# Patient Record
Sex: Male | Born: 1965 | Race: White | Hispanic: No | Marital: Single | Smoking: Current every day smoker
Health system: Southern US, Community
[De-identification: ages and names within clinical notes are randomized; demographics above are authoritative.]

## PROBLEM LIST (undated history)

## (undated) DIAGNOSIS — J449 Chronic obstructive pulmonary disease, unspecified: Secondary | ICD-10-CM

## (undated) DIAGNOSIS — G809 Cerebral palsy, unspecified: Secondary | ICD-10-CM

## (undated) DIAGNOSIS — M5136 Other intervertebral disc degeneration, lumbar region: Secondary | ICD-10-CM

## (undated) DIAGNOSIS — M5137 Other intervertebral disc degeneration, lumbosacral region: Secondary | ICD-10-CM

---

## 2018-01-25 ENCOUNTER — Other Ambulatory Visit: Payer: Self-pay

## 2018-01-25 ENCOUNTER — Emergency Department
Admission: EM | Admit: 2018-01-25 | Discharge: 2018-01-25 | Disposition: A | Discharge status: Home / Self Care | Payer: Medicare Other | Attending: Emergency Medicine | Admitting: Emergency Medicine

## 2018-01-25 ENCOUNTER — Emergency Department: Payer: Medicare Other

## 2018-01-25 ENCOUNTER — Encounter: Payer: Self-pay | Admitting: Emergency Medicine

## 2018-01-25 DIAGNOSIS — S199XXA Unspecified injury of neck, initial encounter: Secondary | ICD-10-CM | POA: Diagnosis present

## 2018-01-25 DIAGNOSIS — Y999 Unspecified external cause status: Secondary | ICD-10-CM | POA: Diagnosis not present

## 2018-01-25 DIAGNOSIS — S161XXA Strain of muscle, fascia and tendon at neck level, initial encounter: Secondary | ICD-10-CM | POA: Insufficient documentation

## 2018-01-25 DIAGNOSIS — S39012A Strain of muscle, fascia and tendon of lower back, initial encounter: Secondary | ICD-10-CM | POA: Diagnosis not present

## 2018-01-25 DIAGNOSIS — Z8669 Personal history of other diseases of the nervous system and sense organs: Secondary | ICD-10-CM | POA: Insufficient documentation

## 2018-01-25 DIAGNOSIS — Y9389 Activity, other specified: Secondary | ICD-10-CM | POA: Insufficient documentation

## 2018-01-25 DIAGNOSIS — Y92410 Unspecified street and highway as the place of occurrence of the external cause: Secondary | ICD-10-CM | POA: Diagnosis not present

## 2018-01-25 DIAGNOSIS — Z79891 Long term (current) use of opiate analgesic: Secondary | ICD-10-CM | POA: Diagnosis not present

## 2018-01-25 HISTORY — DX: Cerebral palsy, unspecified: G80.9

## 2018-01-25 HISTORY — DX: Other intervertebral disc degeneration, lumbar region: M51.36

## 2018-01-25 HISTORY — DX: Chronic obstructive pulmonary disease, unspecified: J44.9

## 2018-01-25 HISTORY — DX: Other intervertebral disc degeneration, lumbosacral region: M51.37

## 2018-01-25 LAB — COMPREHENSIVE METABOLIC PANEL
ALT: 49 U/L (ref 17–63)
AST: 50 U/L — AB (ref 15–41)
Albumin: 4.1 g/dL (ref 3.5–5.0)
Alkaline Phosphatase: 48 U/L (ref 38–126)
Anion gap: 7 (ref 5–15)
BUN: 12 mg/dL (ref 6–20)
CHLORIDE: 105 mmol/L (ref 101–111)
CO2: 24 mmol/L (ref 22–32)
CREATININE: 0.65 mg/dL (ref 0.61–1.24)
Calcium: 8.6 mg/dL — ABNORMAL LOW (ref 8.9–10.3)
Glucose, Bld: 136 mg/dL — ABNORMAL HIGH (ref 65–99)
POTASSIUM: 3.7 mmol/L (ref 3.5–5.1)
SODIUM: 136 mmol/L (ref 135–145)
Total Bilirubin: 0.8 mg/dL (ref 0.3–1.2)
Total Protein: 6.8 g/dL (ref 6.5–8.1)

## 2018-01-25 LAB — CBC WITH DIFFERENTIAL/PLATELET
BASOS ABS: 0.1 10*3/uL (ref 0–0.1)
Basophils Relative: 1 %
EOS ABS: 0.2 10*3/uL (ref 0–0.7)
EOS PCT: 2 %
HCT: 41.9 % (ref 40.0–52.0)
Hemoglobin: 14.3 g/dL (ref 13.0–18.0)
Lymphocytes Relative: 15 %
Lymphs Abs: 1.1 10*3/uL (ref 1.0–3.6)
MCH: 31.5 pg (ref 26.0–34.0)
MCHC: 34 g/dL (ref 32.0–36.0)
MCV: 92.6 fL (ref 80.0–100.0)
MONO ABS: 0.6 10*3/uL (ref 0.2–1.0)
Monocytes Relative: 8 %
Neutro Abs: 5.1 10*3/uL (ref 1.4–6.5)
Neutrophils Relative %: 74 %
PLATELETS: 251 10*3/uL (ref 150–440)
RBC: 4.53 MIL/uL (ref 4.40–5.90)
RDW: 14 % (ref 11.5–14.5)
WBC: 7 10*3/uL (ref 3.8–10.6)

## 2018-01-25 LAB — URINE DRUG SCREEN, QUALITATIVE (ARMC ONLY)
AMPHETAMINES, UR SCREEN: NOT DETECTED
Benzodiazepine, Ur Scrn: NOT DETECTED
COCAINE METABOLITE, UR ~~LOC~~: NOT DETECTED
Cannabinoid 50 Ng, Ur ~~LOC~~: NOT DETECTED
MDMA (ECSTASY) UR SCREEN: NOT DETECTED
Methadone Scn, Ur: NOT DETECTED
Opiate, Ur Screen: NOT DETECTED
Phencyclidine (PCP) Ur S: NOT DETECTED
TRICYCLIC, UR SCREEN: NOT DETECTED

## 2018-01-25 LAB — ETHANOL

## 2018-01-25 MED ORDER — HYDROMORPHONE HCL 1 MG/ML IJ SOLN: 1.0000 mg | Freq: Once | INTRAMUSCULAR | Status: DC

## 2018-01-25 MED ORDER — OXYCODONE-ACETAMINOPHEN 5-325 MG PO TABS
2.0000 | ORAL_TABLET | Freq: Once | ORAL | Status: AC
  Administered 2018-01-25: 2 via ORAL

## 2018-01-25 MED ORDER — OXYCODONE-ACETAMINOPHEN 5-325 MG PO TABS
ORAL_TABLET | ORAL | Status: AC
  Administered 2018-01-25: 2 via ORAL
  Filled 2018-01-25: qty 2
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
## 2018-06-10 DIAGNOSIS — G8929 Other chronic pain: Secondary | ICD-10-CM | POA: Diagnosis not present

## 2018-06-10 DIAGNOSIS — M47816 Spondylosis without myelopathy or radiculopathy, lumbar region: Secondary | ICD-10-CM | POA: Diagnosis not present

## 2018-06-10 DIAGNOSIS — G808 Other cerebral palsy: Secondary | ICD-10-CM | POA: Diagnosis not present

## 2018-06-10 DIAGNOSIS — Z23 Encounter for immunization: Secondary | ICD-10-CM | POA: Diagnosis not present

## 2018-06-10 DIAGNOSIS — J449 Chronic obstructive pulmonary disease, unspecified: Secondary | ICD-10-CM | POA: Diagnosis not present

## 2018-06-10 DIAGNOSIS — M546 Pain in thoracic spine: Secondary | ICD-10-CM | POA: Diagnosis not present

## 2018-06-10 DIAGNOSIS — F1190 Opioid use, unspecified, uncomplicated: Secondary | ICD-10-CM | POA: Diagnosis not present

## 2018-06-17 DIAGNOSIS — F332 Major depressive disorder, recurrent severe without psychotic features: Secondary | ICD-10-CM | POA: Diagnosis not present

## 2018-06-17 DIAGNOSIS — J449 Chronic obstructive pulmonary disease, unspecified: Secondary | ICD-10-CM | POA: Diagnosis not present

## 2018-07-29 DIAGNOSIS — G8929 Other chronic pain: Secondary | ICD-10-CM | POA: Diagnosis not present

## 2018-07-29 DIAGNOSIS — F332 Major depressive disorder, recurrent severe without psychotic features: Secondary | ICD-10-CM | POA: Diagnosis not present

## 2018-08-02 DIAGNOSIS — Z79891 Long term (current) use of opiate analgesic: Secondary | ICD-10-CM | POA: Diagnosis not present

## 2018-08-02 DIAGNOSIS — Z9189 Other specified personal risk factors, not elsewhere classified: Secondary | ICD-10-CM | POA: Diagnosis not present

## 2018-08-02 DIAGNOSIS — G808 Other cerebral palsy: Secondary | ICD-10-CM | POA: Diagnosis not present

## 2018-08-02 DIAGNOSIS — G629 Polyneuropathy, unspecified: Secondary | ICD-10-CM | POA: Diagnosis not present

## 2018-08-17 DIAGNOSIS — G894 Chronic pain syndrome: Secondary | ICD-10-CM | POA: Diagnosis not present

## 2018-08-17 DIAGNOSIS — Z720 Tobacco use: Secondary | ICD-10-CM | POA: Diagnosis not present

## 2018-08-17 DIAGNOSIS — F410 Panic disorder [episodic paroxysmal anxiety] without agoraphobia: Secondary | ICD-10-CM | POA: Diagnosis not present

## 2018-08-17 DIAGNOSIS — M47816 Spondylosis without myelopathy or radiculopathy, lumbar region: Secondary | ICD-10-CM | POA: Diagnosis not present

## 2018-08-17 DIAGNOSIS — F329 Major depressive disorder, single episode, unspecified: Secondary | ICD-10-CM | POA: Diagnosis not present

## 2018-08-17 DIAGNOSIS — G629 Polyneuropathy, unspecified: Secondary | ICD-10-CM | POA: Diagnosis not present

## 2018-08-25 DIAGNOSIS — M47817 Spondylosis without myelopathy or radiculopathy, lumbosacral region: Secondary | ICD-10-CM | POA: Diagnosis not present

## 2018-08-25 DIAGNOSIS — M47816 Spondylosis without myelopathy or radiculopathy, lumbar region: Secondary | ICD-10-CM | POA: Diagnosis not present

## 2018-08-27 DIAGNOSIS — M546 Pain in thoracic spine: Secondary | ICD-10-CM | POA: Diagnosis not present

## 2018-08-27 DIAGNOSIS — J449 Chronic obstructive pulmonary disease, unspecified: Secondary | ICD-10-CM | POA: Diagnosis not present

## 2018-08-27 DIAGNOSIS — F332 Major depressive disorder, recurrent severe without psychotic features: Secondary | ICD-10-CM | POA: Diagnosis not present

## 2018-08-27 DIAGNOSIS — M47816 Spondylosis without myelopathy or radiculopathy, lumbar region: Secondary | ICD-10-CM | POA: Diagnosis not present

## 2018-08-27 DIAGNOSIS — G8929 Other chronic pain: Secondary | ICD-10-CM | POA: Diagnosis not present

## 2018-08-27 DIAGNOSIS — G808 Other cerebral palsy: Secondary | ICD-10-CM | POA: Diagnosis not present

## 2018-08-30 DIAGNOSIS — K5900 Constipation, unspecified: Secondary | ICD-10-CM | POA: Diagnosis not present

## 2018-08-30 DIAGNOSIS — F17200 Nicotine dependence, unspecified, uncomplicated: Secondary | ICD-10-CM | POA: Diagnosis not present

## 2018-08-30 DIAGNOSIS — J449 Chronic obstructive pulmonary disease, unspecified: Secondary | ICD-10-CM | POA: Diagnosis not present

## 2018-10-13 DIAGNOSIS — G8929 Other chronic pain: Secondary | ICD-10-CM | POA: Diagnosis not present

## 2018-10-13 DIAGNOSIS — F1190 Opioid use, unspecified, uncomplicated: Secondary | ICD-10-CM | POA: Diagnosis not present

## 2018-10-13 DIAGNOSIS — G808 Other cerebral palsy: Secondary | ICD-10-CM | POA: Diagnosis not present

## 2018-10-13 DIAGNOSIS — G894 Chronic pain syndrome: Secondary | ICD-10-CM | POA: Diagnosis not present

## 2018-10-13 DIAGNOSIS — M546 Pain in thoracic spine: Secondary | ICD-10-CM | POA: Diagnosis not present

## 2018-10-13 DIAGNOSIS — M47816 Spondylosis without myelopathy or radiculopathy, lumbar region: Secondary | ICD-10-CM | POA: Diagnosis not present

## 2018-12-01 DIAGNOSIS — F329 Major depressive disorder, single episode, unspecified: Secondary | ICD-10-CM | POA: Diagnosis not present

## 2018-12-01 DIAGNOSIS — F410 Panic disorder [episodic paroxysmal anxiety] without agoraphobia: Secondary | ICD-10-CM | POA: Diagnosis not present

## 2018-12-01 DIAGNOSIS — Z720 Tobacco use: Secondary | ICD-10-CM | POA: Diagnosis not present

## 2018-12-10 DIAGNOSIS — G8929 Other chronic pain: Secondary | ICD-10-CM | POA: Diagnosis not present

## 2018-12-10 DIAGNOSIS — K5900 Constipation, unspecified: Secondary | ICD-10-CM | POA: Diagnosis not present

## 2018-12-10 DIAGNOSIS — Z87891 Personal history of nicotine dependence: Secondary | ICD-10-CM | POA: Diagnosis not present

## 2018-12-10 DIAGNOSIS — J449 Chronic obstructive pulmonary disease, unspecified: Secondary | ICD-10-CM | POA: Diagnosis not present

## 2019-01-05 DIAGNOSIS — F410 Panic disorder [episodic paroxysmal anxiety] without agoraphobia: Secondary | ICD-10-CM | POA: Diagnosis not present

## 2019-01-05 DIAGNOSIS — F329 Major depressive disorder, single episode, unspecified: Secondary | ICD-10-CM | POA: Diagnosis not present

## 2019-01-05 DIAGNOSIS — Z720 Tobacco use: Secondary | ICD-10-CM | POA: Diagnosis not present

## 2019-01-12 DIAGNOSIS — M545 Low back pain: Secondary | ICD-10-CM | POA: Diagnosis not present

## 2019-01-12 DIAGNOSIS — G8929 Other chronic pain: Secondary | ICD-10-CM | POA: Diagnosis not present

## 2019-02-15 DIAGNOSIS — G629 Polyneuropathy, unspecified: Secondary | ICD-10-CM | POA: Diagnosis not present

## 2019-02-15 DIAGNOSIS — G801 Spastic diplegic cerebral palsy: Secondary | ICD-10-CM | POA: Diagnosis not present

## 2019-02-15 DIAGNOSIS — M5442 Lumbago with sciatica, left side: Secondary | ICD-10-CM | POA: Diagnosis not present

## 2019-02-15 DIAGNOSIS — G8929 Other chronic pain: Secondary | ICD-10-CM | POA: Diagnosis not present

## 2019-02-15 DIAGNOSIS — G249 Dystonia, unspecified: Secondary | ICD-10-CM | POA: Diagnosis not present

## 2019-02-15 DIAGNOSIS — R269 Unspecified abnormalities of gait and mobility: Secondary | ICD-10-CM | POA: Diagnosis not present

## 2019-02-15 DIAGNOSIS — R2681 Unsteadiness on feet: Secondary | ICD-10-CM | POA: Diagnosis not present

## 2019-03-08 DIAGNOSIS — G801 Spastic diplegic cerebral palsy: Secondary | ICD-10-CM | POA: Diagnosis not present

## 2019-03-08 DIAGNOSIS — F39 Unspecified mood [affective] disorder: Secondary | ICD-10-CM | POA: Diagnosis not present

## 2019-03-08 DIAGNOSIS — F17200 Nicotine dependence, unspecified, uncomplicated: Secondary | ICD-10-CM | POA: Diagnosis not present

## 2019-03-10 DIAGNOSIS — G249 Dystonia, unspecified: Secondary | ICD-10-CM | POA: Diagnosis not present

## 2019-03-10 DIAGNOSIS — G808 Other cerebral palsy: Secondary | ICD-10-CM | POA: Diagnosis not present

## 2019-03-10 DIAGNOSIS — G629 Polyneuropathy, unspecified: Secondary | ICD-10-CM | POA: Diagnosis not present

## 2019-03-10 DIAGNOSIS — M545 Low back pain: Secondary | ICD-10-CM | POA: Diagnosis not present

## 2019-03-21 DIAGNOSIS — F329 Major depressive disorder, single episode, unspecified: Secondary | ICD-10-CM | POA: Diagnosis not present

## 2019-03-21 DIAGNOSIS — G894 Chronic pain syndrome: Secondary | ICD-10-CM | POA: Diagnosis not present

## 2019-03-21 DIAGNOSIS — Z79891 Long term (current) use of opiate analgesic: Secondary | ICD-10-CM | POA: Diagnosis not present

## 2019-03-21 DIAGNOSIS — F410 Panic disorder [episodic paroxysmal anxiety] without agoraphobia: Secondary | ICD-10-CM | POA: Diagnosis not present

## 2019-03-21 DIAGNOSIS — Z720 Tobacco use: Secondary | ICD-10-CM | POA: Diagnosis not present

## 2019-04-27 DIAGNOSIS — F410 Panic disorder [episodic paroxysmal anxiety] without agoraphobia: Secondary | ICD-10-CM | POA: Diagnosis not present

## 2019-04-27 DIAGNOSIS — F329 Major depressive disorder, single episode, unspecified: Secondary | ICD-10-CM | POA: Diagnosis not present

## 2019-04-27 DIAGNOSIS — Z720 Tobacco use: Secondary | ICD-10-CM | POA: Diagnosis not present

## 2019-04-29 DIAGNOSIS — J449 Chronic obstructive pulmonary disease, unspecified: Secondary | ICD-10-CM | POA: Diagnosis not present

## 2019-04-29 DIAGNOSIS — G894 Chronic pain syndrome: Secondary | ICD-10-CM | POA: Diagnosis not present

## 2019-04-29 DIAGNOSIS — M47816 Spondylosis without myelopathy or radiculopathy, lumbar region: Secondary | ICD-10-CM | POA: Diagnosis not present

## 2019-04-29 DIAGNOSIS — M545 Low back pain: Secondary | ICD-10-CM | POA: Diagnosis not present

## 2019-04-29 DIAGNOSIS — Z8669 Personal history of other diseases of the nervous system and sense organs: Secondary | ICD-10-CM | POA: Diagnosis not present

## 2019-04-29 DIAGNOSIS — G629 Polyneuropathy, unspecified: Secondary | ICD-10-CM | POA: Diagnosis not present

## 2019-04-29 DIAGNOSIS — Z79899 Other long term (current) drug therapy: Secondary | ICD-10-CM | POA: Diagnosis not present

## 2019-04-29 DIAGNOSIS — Z79891 Long term (current) use of opiate analgesic: Secondary | ICD-10-CM | POA: Diagnosis not present

## 2019-04-29 DIAGNOSIS — Z7951 Long term (current) use of inhaled steroids: Secondary | ICD-10-CM | POA: Diagnosis not present
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
